# Patient Record
Sex: Male | Born: 1990 | Race: Black or African American | Hispanic: No | Marital: Single | Smoking: Current every day smoker
Health system: Southern US, Community
[De-identification: ages and names within clinical notes are randomized; demographics above are authoritative.]

---

## 2019-08-28 DIAGNOSIS — S62337A Displaced fracture of neck of fifth metacarpal bone, left hand, initial encounter for closed fracture: Secondary | ICD-10-CM | POA: Insufficient documentation

## 2020-10-04 ENCOUNTER — Emergency Department: Payer: BC Managed Care – PPO

## 2020-10-04 ENCOUNTER — Emergency Department: Admit: 2020-10-04 | Discharge status: Absent without leave | Payer: Self-pay

## 2020-10-04 ENCOUNTER — Emergency Department (INDEPENDENT_AMBULATORY_CARE_PROVIDER_SITE_OTHER): Payer: BC Managed Care – PPO

## 2020-10-04 ENCOUNTER — Emergency Department (INDEPENDENT_AMBULATORY_CARE_PROVIDER_SITE_OTHER)
Admission: EM | Admit: 2020-10-04 | Discharge: 2020-10-04 | Disposition: A | Discharge status: Home / Self Care | Payer: BC Managed Care – PPO | Source: Home / Self Care | Attending: Family Medicine | Admitting: Family Medicine

## 2020-10-04 ENCOUNTER — Encounter: Payer: Self-pay | Admitting: Emergency Medicine

## 2020-10-04 ENCOUNTER — Other Ambulatory Visit: Payer: Self-pay

## 2020-10-04 DIAGNOSIS — Y9362 Activity, american flag or touch football: Secondary | ICD-10-CM

## 2020-10-04 DIAGNOSIS — R0781 Pleurodynia: Secondary | ICD-10-CM

## 2020-10-04 DIAGNOSIS — S2231XA Fracture of one rib, right side, initial encounter for closed fracture: Secondary | ICD-10-CM | POA: Diagnosis not present

## 2020-10-04 MED ORDER — ACETAMINOPHEN 325 MG PO TABS
650.0000 mg | ORAL_TABLET | ORAL | Status: AC
  Administered 2020-10-04: 650 mg via ORAL

## 2020-10-04 NOTE — ED Provider Notes (Signed)
[facility]    CSN: [id number] Arrival date & time: 10/04/20  1133      History   Chief Complaint Chief Complaint  Patient presents with  . Chest Pain    HPI Daniel Mcclain is a 29 y.o. male.   While playing flag football four weeks ago, patient fell and another player fell on his chest.  He had mild anterior chest discomfort for several days before improving.  While playing football three days ago, he again fell and a player landed on his sternum area.  He has had persistent right mid-back and chest pain, worse with inspiration and movement.  He denies shortness of breath. He states that he had a fracture in his right hand last year.  The history is provided by the patient.  Chest Pain Pain location:  R chest and R lateral chest Pain quality: aching and sharp   Pain radiates to:  Does not radiate Pain severity:  Mild Onset quality:  Sudden Duration:  3 days Timing:  Constant Progression:  Unchanged Chronicity:  Recurrent Context: breathing, lifting, movement, at rest and trauma   Relieved by:  Nothing Worsened by:  Certain positions, coughing, deep breathing and movement Ineffective treatments: ice pack, Tylenol, ibuprofen. Associated symptoms: back pain   Associated symptoms: no abdominal pain, no cough, no diaphoresis, no fatigue, no fever, no shortness of breath and no syncope     History reviewed. No pertinent past medical history.  Patient Active Problem List   Diagnosis Date Noted  . Displaced fracture of neck of fifth metacarpal bone, left hand, initial encounter for closed fracture 08/28/2019    History reviewed. No pertinent surgical history.     Home Medications    Prior to Admission medications   Not on File    Family History Family History  Problem Relation Age of Onset  . Healthy Mother   . Healthy Father     Social History Social History   Tobacco Use  . Smoking status: Current Every Day Smoker    Packs/day: 0.25     Years: 5.00    Pack years: 1.25    Types: Cigarettes  . Smokeless tobacco: Never Used  Vaping Use  . Vaping Use: Never used  Substance Use Topics  . Alcohol use: Not Currently  . Drug use: Never     Allergies   Iodine and Shellfish allergy   Review of Systems Review of Systems  Constitutional: Negative for diaphoresis, fatigue and fever.  Respiratory: Negative for cough and shortness of breath.   Cardiovascular: Positive for chest pain. Negative for syncope.  Gastrointestinal: Negative for abdominal pain.  Musculoskeletal: Positive for back pain.  All other systems reviewed and are negative.    Physical Exam Triage Vital Signs ED Triage Vitals  Enc Vitals Group     BP 10/04/20 1228 123/84     Pulse Rate 10/04/20 1228 (!) 56     Resp --      Temp 10/04/20 1228 98.7 F (37.1 C)     Temp Source 10/04/20 1228 Oral     SpO2 10/04/20 1228 100 %     Weight 10/04/20 1233 146 lb (66.2 kg)     Height 10/04/20 1233 5\' 8"  (1.727 m)     Head Circumference --      Peak Flow --      Pain Score 10/04/20 1233 6     Pain Loc --      Pain Edu? --  Excl. in GC? --    No data found.  Updated Vital Signs BP 123/84 (BP Location: Right Arm)   Pulse (!) 56   Temp 98.7 F (37.1 C) (Oral)   Ht 5\' 8"  (1.727 m)   Wt 66.2 kg   SpO2 100%   BMI 22.20 kg/m   Visual Acuity Right Eye Distance:   Left Eye Distance:   Bilateral Distance:    Right Eye Near:   Left Eye Near:    Bilateral Near:     Physical Exam Vitals and nursing note reviewed.  Constitutional:      General: He is not in acute distress. HENT:     Head: Atraumatic.     Right Ear: External ear normal.     Left Ear: External ear normal.     Nose: Nose normal.     Mouth/Throat:     Pharynx: Oropharynx is clear.  Eyes:     Conjunctiva/sclera: Conjunctivae normal.     Pupils: Pupils are equal, round, and reactive to light.  Cardiovascular:     Rate and Rhythm: Normal rate and regular rhythm.     Heart  sounds: Normal heart sounds.  Pulmonary:     Effort: No respiratory distress.     Breath sounds: Normal breath sounds. No wheezing or rales.       Comments: Tenderness to palpation right posterior/lateral chest as noted on diagram.  Chest:     Chest wall: Tenderness present.  Abdominal:     Palpations: Abdomen is soft.     Tenderness: There is no abdominal tenderness.  Musculoskeletal:        General: No tenderness.     Cervical back: Normal range of motion.     Right lower leg: No edema.     Left lower leg: No edema.  Skin:    General: Skin is warm and dry.     Findings: No rash.  Neurological:     Mental Status: He is alert and oriented to person, place, and time.      UC Treatments / Results  Labs (all labs ordered are listed, but only abnormal results are displayed) Labs Reviewed  VITAMIN D 25 HYDROXY (VIT D DEFICIENCY, FRACTURES)    EKG   Radiology DG Chest 2 View  Result Date: 10/04/2020 CLINICAL DATA:  Right sided rib pain after a football injury 4 weeks ago. EXAM: CHEST - 2 VIEW COMPARISON:  Rib films same day. FINDINGS: Heart and mediastinal shadows are normal. The lungs are clear. No pneumothorax or hemothorax. Minimally displaced fracture of the right posterior tenth rib better shown by rib radiographs. No evidence of sternal injury. Scoliotic curvature of the spine. IMPRESSION: No active cardiopulmonary disease. Minimally displaced fracture of the right posterior tenth rib better shown by rib radiographs. Electronically Signed   By: 13/01/2020 M.D.   On: 10/04/2020 13:31   DG Ribs Unilateral Right  Result Date: 10/04/2020 CLINICAL DATA:  Right-sided rib pain EXAM: RIGHT RIBS - 2 VIEW COMPARISON:  Chest radiography same day FINDINGS: Minimally displaced fracture of the right posterior tenth rib. No other acute rib finding. Mild scoliotic curvature of the spine. IMPRESSION: Minimally displaced fracture of the right posterior tenth rib. Electronically Signed    By: 13/01/2020 M.D.   On: 10/04/2020 13:30    Procedures Procedures (including critical care time)  Medications Ordered in UC Medications  acetaminophen (TYLENOL) tablet 650 mg (650 mg Oral Given 10/04/20 1247)    Initial Impression /  Assessment and Plan / UC Course  I have reviewed the triage vital signs and the nursing notes.  Pertinent labs & imaging results that were available during my care of the patient were reviewed by me and considered in my medical decision making (see chart for details).    Dispensed rib belt.   Check Vitamin D 25-OH   Final Clinical Impressions(s) / UC Diagnoses   Final diagnoses:  Closed fracture of one rib of right side, initial encounter     Discharge Instructions     If helpful, may wear a rib belt sparingly while at work. Ensure adequate vitamin D and calcium intake. For pain may take Tylenol as needed. Use an Facilities manager several times daily to strengthen rib muscles.    ED Prescriptions    None        Lattie Haw, MD 10/08/20 [phone]

## 2020-10-04 NOTE — ED Triage Notes (Signed)
Mid chest pain/ R sided back pain/left thigh pain  x 4 weeks after being hit in flag football Minimal pain relief w/ OTC Tylenol & Motrin & Ice  NO COVID vaccine Pt states he is sleeping on a new mattress x 2 months on floor - wonders if that is the problem

## 2020-10-04 NOTE — Discharge Instructions (Addendum)
If helpful, may wear a rib belt sparingly while at work. Ensure adequate vitamin D and calcium intake. For pain may take Tylenol as needed. Use an Facilities manager several times daily to strengthen rib muscles.

## 2020-10-05 LAB — VITAMIN D 25 HYDROXY (VIT D DEFICIENCY, FRACTURES): Vit D, 25-Hydroxy: 13 ng/mL — ABNORMAL LOW (ref 30–100)

## 2020-10-07 ENCOUNTER — Telehealth (HOSPITAL_COMMUNITY): Payer: Self-pay | Admitting: Emergency Medicine

## 2020-10-07 MED ORDER — VITAMIN D (ERGOCALCIFEROL) 1.25 MG (50000 UNIT) PO CAPS: 50000.0000 [IU] | ORAL_CAPSULE | ORAL | 0 refills | Status: AC

## 2020-12-21 ENCOUNTER — Other Ambulatory Visit: Payer: Self-pay

## 2020-12-21 ENCOUNTER — Emergency Department (INDEPENDENT_AMBULATORY_CARE_PROVIDER_SITE_OTHER)
Admission: EM | Admit: 2020-12-21 | Discharge: 2020-12-21 | Disposition: A | Discharge status: Home / Self Care | Payer: BC Managed Care – PPO | Source: Home / Self Care

## 2020-12-21 DIAGNOSIS — K089 Disorder of teeth and supporting structures, unspecified: Secondary | ICD-10-CM

## 2020-12-21 DIAGNOSIS — K047 Periapical abscess without sinus: Secondary | ICD-10-CM

## 2020-12-21 MED ORDER — CHLORHEXIDINE GLUCONATE 0.12 % MT SOLN: 15.0000 mL | Freq: Two times a day (BID) | OROMUCOSAL | 0 refills | Status: DC

## 2020-12-21 MED ORDER — AMOXICILLIN 875 MG PO TABS: 875.0000 mg | ORAL_TABLET | Freq: Two times a day (BID) | ORAL | 0 refills | Status: DC

## 2020-12-21 NOTE — ED Provider Notes (Signed)
[facility]    CSN: [id number] Arrival date & time: 12/21/20  1553      History   Chief Complaint Chief Complaint  Patient presents with  . Dental Pain    HPI Daniel Mcclain is a 30 y.o. male.   HPI  Patient presents today with dental pain with possible abscess.  Patient has had dentition problems for over 10 months. He also has some chipped teeth.  He is a current daily smoker.  He has been taking ibuprofen as needed and complains of 10 out of 10 pain related to dental problem.Patient complains of pain in the lower right molar tooth in the right of mouth and also has gingival hyperplasia encapsulating the tooth which is causing significant pain. Patient has dental insurance and has not seen a dental provider.  History reviewed. No pertinent past medical history.  Patient Active Problem List   Diagnosis Date Noted  . Displaced fracture of neck of fifth metacarpal bone, left hand, initial encounter for closed fracture 08/28/2019    History reviewed. No pertinent surgical history.     Home Medications    Prior to Admission medications   Medication Sig Start Date End Date Taking? Authorizing Provider  Vitamin D, Ergocalciferol, (DRISDOL) 1.25 MG (50000 UNIT) CAPS capsule Take 1 capsule (50,000 Units total) by mouth every 7 (seven) days. 10/07/20   LampteyBritta Mccreedy, MD    Family History Family History  Problem Relation Age of Onset  . Healthy Mother   . Healthy Father     Social History Social History   Tobacco Use  . Smoking status: Current Every Day Smoker    Packs/day: 0.25    Years: 5.00    Pack years: 1.25    Types: Cigarettes  . Smokeless tobacco: Never Used  Vaping Use  . Vaping Use: Never used  Substance Use Topics  . Alcohol use: Not Currently  . Drug use: Never     Allergies   Iodine and Shellfish allergy   Review of Systems Review of Systems Pertinent negatives listed in HPI   Physical Exam Triage Vital Signs ED Triage  Vitals  Enc Vitals Group     BP 12/21/20 1707 132/79     Pulse Rate 12/21/20 1707 (!) 57     Resp 12/21/20 1707 18     Temp 12/21/20 1707 98.1 F (36.7 C)     Temp Source 12/21/20 1707 Oral     SpO2 12/21/20 1707 98 %     Weight --      Height --      Head Circumference --      Peak Flow --      Pain Score 12/21/20 1712 10     Pain Loc --      Pain Edu? --      Excl. in GC? --    No data found.  Updated Vital Signs BP 132/79 (BP Location: Right Arm)   Pulse (!) 57   Temp 98.1 F (36.7 C) (Oral)   Resp 18   SpO2 98%   Visual Acuity Right Eye Distance:   Left Eye Distance:   Bilateral Distance:    Right Eye Near:   Left Eye Near:    Bilateral Near:     Physical Exam Constitutional:      Appearance: Normal appearance.  HENT:     Nose: Nose normal.     Mouth/Throat:     Dentition: Abnormal dentition. Gingival swelling and dental caries present.  No gum lesions.     Pharynx: Oropharynx is clear.  Cardiovascular:     Rate and Rhythm: Normal rate and regular rhythm.  Pulmonary:     Effort: Pulmonary effort is normal.     Breath sounds: Normal breath sounds.  Neurological:     Mental Status: He is alert.  Psychiatric:        Attention and Perception: Attention normal.        Mood and Affect: Mood normal.      UC Treatments / Results  Labs (all labs ordered are listed, but only abnormal results are displayed) Labs Reviewed - No data to display  EKG   Radiology No results found.  Procedures Procedures (including critical care time)  Medications Ordered in UC Medications - No data to display  Initial Impression / Assessment and Plan / UC Course  I have reviewed the triage vital signs and the nursing notes.  Pertinent labs & imaging results that were available during my care of the patient were reviewed by me and considered in my medical decision making (see chart for details).    Dental infection. Treatment per discharge instructions. Information  provided to follow-up with dentist and establish with primary care.  Final Clinical Impressions(s) / UC Diagnoses   Final diagnoses:  Dental infection  Poor dentition     Discharge Instructions      Take all antibiotics as prescribed.  Rinse and spit with the Peridex oral mouth rinse 2 times daily until the entire course of rinses completed. I have provided you with contact information to establish with a primary care provider and a dental provider.     ED Prescriptions    Medication Sig Dispense Auth. Provider   amoxicillin (AMOXIL) 875 MG tablet Take 1 tablet (875 mg total) by mouth 2 (two) times daily. 20 tablet Bing Neighbors, FNP   chlorhexidine (PERIDEX) 0.12 % solution Use as directed 15 mLs in the mouth or throat 2 (two) times daily. 120 mL Bing Neighbors, FNP     PDMP not reviewed this encounter.   Bing Neighbors, FNP 12/24/20 [phone]

## 2020-12-21 NOTE — Discharge Instructions (Addendum)
  Take all antibiotics as prescribed.  Rinse and spit with the Peridex oral mouth rinse 2 times daily until the entire course of rinses completed. I have provided you with contact information to establish with a primary care provider and a dental provider.

## 2020-12-21 NOTE — ED Triage Notes (Signed)
Pt c/o dental pain, possible abcess x 10 mos. Some molars are chipped. Pain 10/10 Ibuprofen prn.

## 2021-09-28 ENCOUNTER — Emergency Department (HOSPITAL_COMMUNITY)
Admission: EM | Admit: 2021-09-28 | Discharge: 2021-09-29 | Disposition: A | Discharge status: Home / Self Care | Payer: BC Managed Care – PPO | Attending: Emergency Medicine | Admitting: Emergency Medicine

## 2021-09-28 ENCOUNTER — Encounter (HOSPITAL_COMMUNITY): Payer: Self-pay

## 2021-09-28 ENCOUNTER — Other Ambulatory Visit: Payer: Self-pay

## 2021-09-28 ENCOUNTER — Emergency Department (HOSPITAL_COMMUNITY): Payer: BC Managed Care – PPO

## 2021-09-28 DIAGNOSIS — S6991XA Unspecified injury of right wrist, hand and finger(s), initial encounter: Secondary | ICD-10-CM | POA: Diagnosis present

## 2021-09-28 DIAGNOSIS — W268XXA Contact with other sharp object(s), not elsewhere classified, initial encounter: Secondary | ICD-10-CM | POA: Diagnosis not present

## 2021-09-28 DIAGNOSIS — S61411A Laceration without foreign body of right hand, initial encounter: Secondary | ICD-10-CM | POA: Insufficient documentation

## 2021-09-28 DIAGNOSIS — F17210 Nicotine dependence, cigarettes, uncomplicated: Secondary | ICD-10-CM | POA: Insufficient documentation

## 2021-09-28 MED ORDER — LIDOCAINE-EPINEPHRINE 2 %-1:100000 IJ SOLN: 20.0000 mL | Freq: Once | INTRAMUSCULAR | Status: DC

## 2021-09-28 MED ORDER — TETANUS-DIPHTH-ACELL PERTUSSIS 5-2.5-18.5 LF-MCG/0.5 IM SUSY
0.5000 mL | PREFILLED_SYRINGE | Freq: Once | INTRAMUSCULAR | Status: DC
  Filled 2021-09-28: qty 0.5

## 2021-09-28 MED ORDER — LIDOCAINE-EPINEPHRINE (PF) 2 %-1:200000 IJ SOLN
INTRAMUSCULAR | Status: AC
  Administered 2021-09-29: 10 mL
  Filled 2021-09-28: qty 20

## 2021-09-28 NOTE — Discharge Instructions (Signed)
Please follow instruction below for wound care.  Have your sutures remove in 7 days at Urgent Care center or at your doctor's office.  Return if you notice any sign of infection.

## 2021-09-28 NOTE — ED Triage Notes (Addendum)
Pt states that he hit a mirror and cut his right hand inner thumb side x 2 hrs ago.

## 2021-09-28 NOTE — ED Provider Notes (Addendum)
[facility]ENCY [facility]PT Provider Note   CSN: [id number] Arrival date & time: 09/28/21  2214     History Chief Complaint  Patient presents with   Laceration    Daniel Mcclain is a 30 y.o. male.  The history is provided by the patient. No language interpreter was used.  Laceration Associated symptoms: no fever    30 year old male presenting for evaluation of hand injury.  Patient reports that he accidentally cut his right dominant hand approximately 2 hours ago when he got upset and hit a mirror.  He reported cute onset of sharp throbbing 7 out of 10 pain.  Pain is nonradiating no associated numbness.  No other injury.  He is unsure his last tetanus status.  No specific treatment tried.  No other injury  History reviewed. No pertinent past medical history.  Patient Active Problem List   Diagnosis Date Noted   Displaced fracture of neck of fifth metacarpal bone, left hand, initial encounter for closed fracture 08/28/2019    History reviewed. No pertinent surgical history.     Family History  Problem Relation Age of Onset   Healthy Mother    Healthy Father     Social History   Tobacco Use   Smoking status: Every Day    Packs/day: 0.25    Years: 5.00    Pack years: 1.25    Types: Cigarettes   Smokeless tobacco: Never  Vaping Use   Vaping Use: Never used  Substance Use Topics   Alcohol use: Not Currently   Drug use: Never    Home Medications Prior to Admission medications   Medication Sig Start Date End Date Taking? Authorizing Provider  amoxicillin (AMOXIL) 875 MG tablet Take 1 tablet (875 mg total) by mouth 2 (two) times daily. 12/21/20   Bing Neighbors, FNP  chlorhexidine (PERIDEX) 0.12 % solution Use as directed 15 mLs in the mouth or throat 2 (two) times daily. 12/21/20   Bing Neighbors, FNP  Vitamin D, Ergocalciferol, (DRISDOL) 1.25 MG (50000 UNIT) CAPS capsule Take 1 capsule (50,000 Units total) by mouth every 7 (seven) days.  10/07/20   LampteyBritta Mccreedy, MD    Allergies    Iodine and Shellfish allergy  Review of Systems   Review of Systems  Constitutional:  Negative for fever.  Skin:  Positive for wound.  Neurological:  Negative for numbness.   Physical Exam Updated Vital Signs BP (!) 128/91   Pulse 86   Temp 98.2 F (36.8 C) (Oral)   Resp 15   SpO2 100%   Physical Exam Vitals and nursing note reviewed.  Constitutional:      General: He is not in acute distress.    Appearance: He is well-developed.  HENT:     Head: Atraumatic.  Eyes:     Conjunctiva/sclera: Conjunctivae normal.  Musculoskeletal:        General: Signs of injury (Right hand: 4 cm oblique laceration noted along the hypothenar eminence without foreign body noted.  Sensation is intact distally.) present.     Cervical back: Neck supple.  Skin:    Findings: No rash.  Neurological:     Mental Status: He is alert.    ED Results / Procedures / Treatments   Labs (all labs ordered are listed, but only abnormal results are displayed) Labs Reviewed - No data to display  EKG None  Radiology DG Hand Complete Right  Result Date: 09/28/2021 CLINICAL DATA:  Hit mirror, laceration EXAM: RIGHT HAND -  COMPLETE 3+ VIEW COMPARISON:  None. FINDINGS: No acute bony abnormality. Specifically, no fracture, subluxation, or dislocation. Screw within the right 5th metacarpal. Soft tissues are intact. No radiopaque foreign bodies. IMPRESSION: No acute fracture or foreign body. Electronically Signed   By: Charlett Nose M.D.   On: 09/28/2021 23:02    Procedures .Marland KitchenLaceration Repair  Date/Time: 09/29/2021 12:00 AM Performed by: Fayrene Helper, PA-C Authorized by: Fayrene Helper, PA-C   Consent:    Consent obtained:  Verbal   Consent given by:  Patient   Risks discussed:  Infection, need for additional repair, pain, poor cosmetic result and poor wound healing   Alternatives discussed:  No treatment and delayed treatment Universal protocol:     Procedure explained and questions answered to patient or proxy's satisfaction: yes     Relevant documents present and verified: yes     Test results available: yes     Imaging studies available: yes     Required blood products, implants, devices, and special equipment available: yes     Site/side marked: yes     Immediately prior to procedure, a time out was called: yes     Patient identity confirmed:  Verbally with patient Anesthesia:    Anesthesia method:  Local infiltration   Local anesthetic:  Lidocaine 1% WITH epi Laceration details:    Location:  Hand   Hand location:  R palm   Length (cm):  4   Depth (mm):  3 Pre-procedure details:    Preparation:  Patient was prepped and draped in usual sterile fashion and imaging obtained to evaluate for foreign bodies Exploration:    Limited defect created (wound extended): yes     Hemostasis achieved with:  Direct pressure   Imaging outcome: foreign body not noted     Wound exploration: wound explored through full range of motion and entire depth of wound visualized     Contaminated: no   Treatment:    Area cleansed with:  Povidone-iodine and saline   Irrigation solution:  Sterile saline   Irrigation method:  Pressure wash   Visualized foreign bodies/material removed: no     Debridement:  Minimal (debridement of dead skin using sterile scissor)   Undermining:  None Skin repair:    Repair method:  Sutures   Suture size:  5-0   Suture material:  Prolene   Suture technique:  Simple interrupted   Number of sutures:  8 Approximation:    Approximation:  Close Repair type:    Repair type:  Intermediate Post-procedure details:    Dressing:  Non-adherent dressing   Procedure completion:  Tolerated well, no immediate complications   Medications Ordered in ED Medications  lidocaine-EPINEPHrine (XYLOCAINE W/EPI) 2 %-1:100000 (with pres) injection 20 mL (has no administration in time range)  Tdap (BOOSTRIX) injection 0.5 mL (has no  administration in time range)  lidocaine-EPINEPHrine (XYLOCAINE W/EPI) 2 %-1:200000 (PF) injection (has no administration in time range)    ED Course  I have reviewed the triage vital signs and the nursing notes.  Pertinent labs & imaging results that were available during my care of the patient were reviewed by me and considered in my medical decision making (see chart for details).    MDM Rules/Calculators/A&P                           BP (!) 128/91   Pulse 86   Temp 98.2 F (36.8 C) (Oral)   Resp 15  SpO2 100%   Final Clinical Impression(s) / ED Diagnoses Final diagnoses:  Laceration of right hand without foreign body, initial encounter    Rx / DC Orders ED Discharge Orders     None      12:01 AM Patient suffered a laceration to his right dominant hand involving the hypothenar muscle.  He is neurovascular intact.  No foreign body noted.  Laceration repair using nonabsorbable sutures which will need to be removed in 7 days.  Wound care instruction provided.  Tetanus updated.   Fayrene Helper, PA-C 09/29/21 0003    Fayrene Helper, PA-C 09/29/21 Salley Hews    Mancel Bale, MD 09/29/21 [phone]

## 2021-09-29 MED ORDER — LIDOCAINE-EPINEPHRINE (PF) 2 %-1:200000 IJ SOLN: 20.0000 mL | Freq: Once | INTRAMUSCULAR | Status: AC

## 2021-10-25 ENCOUNTER — Ambulatory Visit: Payer: Self-pay

## 2021-10-27 ENCOUNTER — Ambulatory Visit: Payer: Self-pay

## 2021-10-30 ENCOUNTER — Ambulatory Visit (HOSPITAL_COMMUNITY)
Admission: EM | Admit: 2021-10-30 | Discharge: 2021-10-30 | Disposition: A | Discharge status: Home / Self Care | Payer: BC Managed Care – PPO

## 2021-10-30 ENCOUNTER — Other Ambulatory Visit: Payer: Self-pay

## 2021-10-30 DIAGNOSIS — Z4802 Encounter for removal of sutures: Secondary | ICD-10-CM | POA: Diagnosis not present

## 2021-10-30 NOTE — ED Triage Notes (Signed)
Pt presents for suture removal; removed 7 sutures from right hand.

## 2021-11-08 ENCOUNTER — Other Ambulatory Visit: Payer: Self-pay

## 2021-11-08 ENCOUNTER — Encounter (HOSPITAL_COMMUNITY): Payer: Self-pay

## 2021-11-08 ENCOUNTER — Ambulatory Visit (HOSPITAL_COMMUNITY)
Admission: EM | Admit: 2021-11-08 | Discharge: 2021-11-08 | Disposition: A | Discharge status: Home / Self Care | Payer: BC Managed Care – PPO | Attending: Emergency Medicine | Admitting: Emergency Medicine

## 2021-11-08 DIAGNOSIS — H66001 Acute suppurative otitis media without spontaneous rupture of ear drum, right ear: Secondary | ICD-10-CM | POA: Diagnosis present

## 2021-11-08 DIAGNOSIS — Z20822 Contact with and (suspected) exposure to covid-19: Secondary | ICD-10-CM | POA: Diagnosis not present

## 2021-11-08 MED ORDER — AMOXICILLIN-POT CLAVULANATE 875-125 MG PO TABS: 1.0000 | ORAL_TABLET | Freq: Two times a day (BID) | ORAL | 0 refills | Status: AC

## 2021-11-08 MED ORDER — FLUTICASONE PROPIONATE 50 MCG/ACT NA SUSP: 2.0000 | Freq: Every day | NASAL | 0 refills | Status: AC

## 2021-11-08 NOTE — Discharge Instructions (Addendum)
Finish the Augmentin, even if you feel better.  Flonase, saline nasal irrigation with a Lloyd Huger Med rinse with distilled water as often as you want for the nasal congestion.  Mucinex D will also help.  COVID Will be back 6 to 24 hours  Below is a list of primary care practices who are taking new patients for you to follow-up with.  [facility] -multiple locations.  See website at [email or web address]  [facility] Ground Floor - [facility], [address] [place], [place], [place] [address] [phone]  [facility] at [facility] [address] [place], [place] [address] [phone]  [facility] [address] [place], [place] [address] [phone]  [facility] [phone] [address] [place] [address]  [facility]: [address] [place] [address]  [phone]  [facility]: [address] [place] [address]  [phone]  [facility] : [address] [place] [address] [phone]  [facility]: [address] [place] [address] [phone]  [facility]: [address] [place] [address] [phone]  Dr. Oneal Grout [address] [facility] [place] [address]  [phone]  Go to www.goodrx.com  or www.[email or web address] to look up your medications. This will give you a list of where you can find your prescriptions at the most affordable prices. Or ask the pharmacist what the cash price is, or if they have any other discount programs available to help make your medication more affordable. This can be less expensive than what you would pay with insurance.

## 2021-11-08 NOTE — ED Provider Notes (Signed)
HPI  SUBJECTIVE:  Daniel Mcclain is a 30 y.o. male who presents with 2 and half days of right ear pain, nasal congestion, rhinorrhea, ear popping, decreased hearing, sore throat, postnasal drip, cough productive of the same material as his nasal congestion, fatigue, headache.  No fevers, otorrhea, loss of sense of smell or taste, shortness of breath, nausea, vomiting, diarrhea, abdominal pain, body aches.  He is also requesting COVID testing.  He was exposed 3 days ago.  He did not get the COVID or flu vaccine.  No known influenza exposure.  No antibiotics in the past month.  No Antipyretic in the past 6 hours.  He does not grind his teeth at night.  He tried tea and vitamins without improvement in his symptoms.  His ear pain is worse with chewing, coughing, lying on his right side and with exposure to loud noise.  He has no past medical history.  PMD: None.  History reviewed. No pertinent past medical history.  History reviewed. No pertinent surgical history.  Family History  Problem Relation Age of Onset   Healthy Mother    Healthy Father     Social History   Tobacco Use   Smoking status: Every Day    Packs/day: 0.25    Years: 5.00    Pack years: 1.25    Types: Cigarettes   Smokeless tobacco: Never  Vaping Use   Vaping Use: Never used  Substance Use Topics   Alcohol use: Not Currently   Drug use: Never    No current facility-administered medications for this encounter.  Current Outpatient Medications:    amoxicillin-clavulanate (AUGMENTIN) 875-125 MG tablet, Take 1 tablet by mouth 2 (two) times daily for 7 days., Disp: 14 tablet, Rfl: 0   fluticasone (FLONASE) 50 MCG/ACT nasal spray, Place 2 sprays into both nostrils daily., Disp: 16 g, Rfl: 0   Vitamin D, Ergocalciferol, (DRISDOL) 1.25 MG (50000 UNIT) CAPS capsule, Take 1 capsule (50,000 Units total) by mouth every 7 (seven) days., Disp: 6 capsule, Rfl: 0  Allergies  Allergen Reactions   Iodine Itching and Swelling    Shellfish Allergy Anaphylaxis     ROS  As noted in HPI.   Physical Exam  BP 109/60 (BP Location: Right Arm)   Pulse 62   Temp 98.4 F (36.9 C) (Oral)   Resp 18   SpO2 97%   Constitutional: Well developed, well nourished, no acute distress Eyes:  EOMI, conjunctiva normal bilaterally HENT: Normocephalic, atraumatic,mucus membranes moist. right external ear, EAC normal.  No pain with traction on pinna, palpation tragus or mastoid.  TM erythematous, dull, bulging.  Decreased hearing right ear compared to left.  Left TM normal.  Positive nasal congestion, erythematous, swollen turbinates.  No maxillary, frontal sinus tenderness.  Normal oropharynx.  No postnasal drip.  Uvula midline.  Neck: No cervical lymphadenopathy.  Respiratory: Normal inspiratory effort, lungs clear bilaterally Cardiovascular: Normal rate, regular rhythm, no murmurs rubs or gallops GI: nondistended skin: No rash, skin intact Musculoskeletal: no deformities Neurologic: Alert & oriented x 3, no focal neuro deficits Psychiatric: Speech and behavior appropriate   ED Course   Medications - No data to display  Orders Placed This Encounter  Procedures   SARS CORONAVIRUS 2 (TAT 6-24 HRS) Nasopharyngeal Nasopharyngeal Swab    Standing Status:   Standing    Number of Occurrences:   1    No results found for this or any previous visit (from the past 24 hour(s)). No results found.  ED Clinical  Impression  1. Non-recurrent acute suppurative otitis media of right ear without spontaneous rupture of tympanic membrane   2. Encounter for laboratory testing for COVID-19 virus      ED Assessment/Plan  Patient with a right-sided otitis media.  will also check COVID due to recent exposure.  He may be a candidate for Molnupiravir based on nonvaccinated status, however, he has no other medical comorbidities.  Home with Augmentin, Flonase, saline nasal irrigation, Mucinex D.  Will provide primary care list and order  assistance in finding a PMD.  Discussed MDM, treatment plan, and plan for follow-up with patient. patient agrees with plan.   Meds ordered this encounter  Medications   amoxicillin-clavulanate (AUGMENTIN) 875-125 MG tablet    Sig: Take 1 tablet by mouth 2 (two) times daily for 7 days.    Dispense:  14 tablet    Refill:  0   fluticasone (FLONASE) 50 MCG/ACT nasal spray    Sig: Place 2 sprays into both nostrils daily.    Dispense:  16 g    Refill:  0      *This clinic note was created using Scientist, clinical (histocompatibility and immunogenetics). Therefore, there may be occasional mistakes despite careful proofreading.  ?    Domenick Gong, MD 11/08/21 [phone]

## 2021-11-08 NOTE — ED Triage Notes (Signed)
Pt presents with cough, chills, and bilateral ear pain X 3 days.

## 2021-11-09 LAB — SARS CORONAVIRUS 2 (TAT 6-24 HRS): SARS Coronavirus 2: NEGATIVE

## 2022-04-26 ENCOUNTER — Emergency Department (INDEPENDENT_AMBULATORY_CARE_PROVIDER_SITE_OTHER)
Admission: EM | Admit: 2022-04-26 | Discharge: 2022-04-26 | Disposition: A | Discharge status: Home / Self Care | Payer: Self-pay | Source: Home / Self Care

## 2022-04-26 ENCOUNTER — Encounter: Payer: Self-pay | Admitting: Emergency Medicine

## 2022-04-26 DIAGNOSIS — K047 Periapical abscess without sinus: Secondary | ICD-10-CM

## 2022-04-26 MED ORDER — AMOXICILLIN-POT CLAVULANATE 875-125 MG PO TABS: 1.0000 | ORAL_TABLET | Freq: Two times a day (BID) | ORAL | 0 refills | Status: AC

## 2022-04-26 NOTE — ED Provider Notes (Signed)
[facility]    CSN: [id number] Arrival date & time: 04/26/22  1949      History   Chief Complaint Chief Complaint  Patient presents with   Dental Pain    HPI Daniel Mcclain is a 31 y.o. male.   HPI-year-old male presents with dental pain x2 days.  History reviewed. No pertinent past medical history.  Patient Active Problem List   Diagnosis Date Noted   Displaced fracture of neck of fifth metacarpal bone, left hand, initial encounter for closed fracture 08/28/2019    History reviewed. No pertinent surgical history.     Home Medications    Prior to Admission medications   Medication Sig Start Date End Date Taking? Authorizing Provider  amoxicillin-clavulanate (AUGMENTIN) 875-125 MG tablet Take 1 tablet by mouth every 12 (twelve) hours for 10 days. 04/26/22 05/06/22 Yes Trevor Iha, FNP  fluticasone (FLONASE) 50 MCG/ACT nasal spray Place 2 sprays into both nostrils daily. 11/08/21   Domenick Gong, MD  Vitamin D, Ergocalciferol, (DRISDOL) 1.25 MG (50000 UNIT) CAPS capsule Take 1 capsule (50,000 Units total) by mouth every 7 (seven) days. 10/07/20   Lamptey, Britta Mccreedy, MD    Family History Family History  Problem Relation Age of Onset   Healthy Mother    Healthy Father     Social History Social History   Tobacco Use   Smoking status: Every Day    Packs/day: 0.25    Years: 5.00    Pack years: 1.25    Types: Cigarettes   Smokeless tobacco: Never  Vaping Use   Vaping Use: Never used  Substance Use Topics   Alcohol use: Not Currently   Drug use: Never     Allergies   Iodine and Shellfish allergy   Review of Systems Review of Systems  HENT:  Positive for dental problem.   All other systems reviewed and are negative.   Physical Exam Triage Vital Signs ED Triage Vitals  Enc Vitals Group     BP      Pulse      Resp      Temp      Temp src      SpO2      Weight      Height      Head Circumference      Peak Flow      Pain Score       Pain Loc      Pain Edu?      Excl. in GC?    No data found.  Updated Vital Signs BP 100/71 (BP Location: Right Arm)   Pulse 93   Temp 98.5 F (36.9 C) (Oral)   Resp 18   SpO2 97%      Physical Exam Vitals and nursing note reviewed.  Constitutional:      Appearance: Normal appearance. He is normal weight.  HENT:     Head: Normocephalic and atraumatic.     Right Ear: Tympanic membrane, ear canal and external ear normal.     Left Ear: Tympanic membrane, ear canal and external ear normal.     Mouth/Throat:     Mouth: Mucous membranes are moist.     Pharynx: Oropharynx is clear.     Comments: Left lower: Second/third molar erythematous, indurated medial/lateral gingival border noted, mild mucopurulent discharge Eyes:     Extraocular Movements: Extraocular movements intact.     Conjunctiva/sclera: Conjunctivae normal.     Pupils: Pupils are equal, round, and reactive to light.  Cardiovascular:     Rate and Rhythm: Normal rate and regular rhythm.     Pulses: Normal pulses.     Heart sounds: Normal heart sounds. No murmur heard. Pulmonary:     Effort: Pulmonary effort is normal.     Breath sounds: Normal breath sounds. No wheezing, rhonchi or rales.  Musculoskeletal:     Cervical back: Normal range of motion and neck supple.  Skin:    General: Skin is warm and dry.  Neurological:     General: No focal deficit present.     Mental Status: He is alert and oriented to person, place, and time.     UC Treatments / Results  Labs (all labs ordered are listed, but only abnormal results are displayed) Labs Reviewed - No data to display  EKG   Radiology No results found.  Procedures Procedures (including critical care time)  Medications Ordered in UC Medications - No data to display  Initial Impression / Assessment and Plan / UC Course  I have reviewed the triage vital signs and the nursing notes.  Pertinent labs & imaging results that were available during my  care of the patient were reviewed by me and considered in my medical decision making (see chart for details).     MDM: 1.  Dental abscess-Rx'd Augmentin. Instructed patient to take medication as directed with food to completion.  Encouraged patient increase daily water intake while taking this medication.  Advised/encouraged patient to follow-up with dentist early next week for further evaluation of dental abscess.  Note provided to patient per request. Patient discharged home, hemodynamically stable. Final Clinical Impressions(s) / UC Diagnoses   Final diagnoses:  Dental abscess     Discharge Instructions      Instructed patient to take medication as directed with food to completion.  Encouraged patient increase daily water intake while taking this medication.  Advised/encouraged patient to follow-up with dentist early next week for further evaluation of dental abscess.     ED Prescriptions     Medication Sig Dispense Auth. Provider   amoxicillin-clavulanate (AUGMENTIN) 875-125 MG tablet Take 1 tablet by mouth every 12 (twelve) hours for 10 days. 20 tablet Trevor Iha, FNP      PDMP not reviewed this encounter.   Trevor Iha, FNP 04/26/22 2017

## 2022-04-26 NOTE — ED Triage Notes (Signed)
Patient presents to Urgent Care with complaints of tooth pain/abscess since 6 month. Patient reports having pain. His tooth broke and then has become infected.

## 2022-04-26 NOTE — Discharge Instructions (Addendum)
Instructed patient to take medication as directed with food to completion.  Encouraged patient increase daily water intake while taking this medication.  Advised/encouraged patient to follow-up with dentist early next week for further evaluation of dental abscess.

## 2022-09-11 IMAGING — DX DG CHEST 2V
2 series · 2 of 2 positions shown · non-contrast
Comparison: Rib films same day.

CLINICAL DATA: Right sided rib pain after a football injury 4 weeks
ago.

EXAM:
CHEST - 2 VIEW

[chest pa]
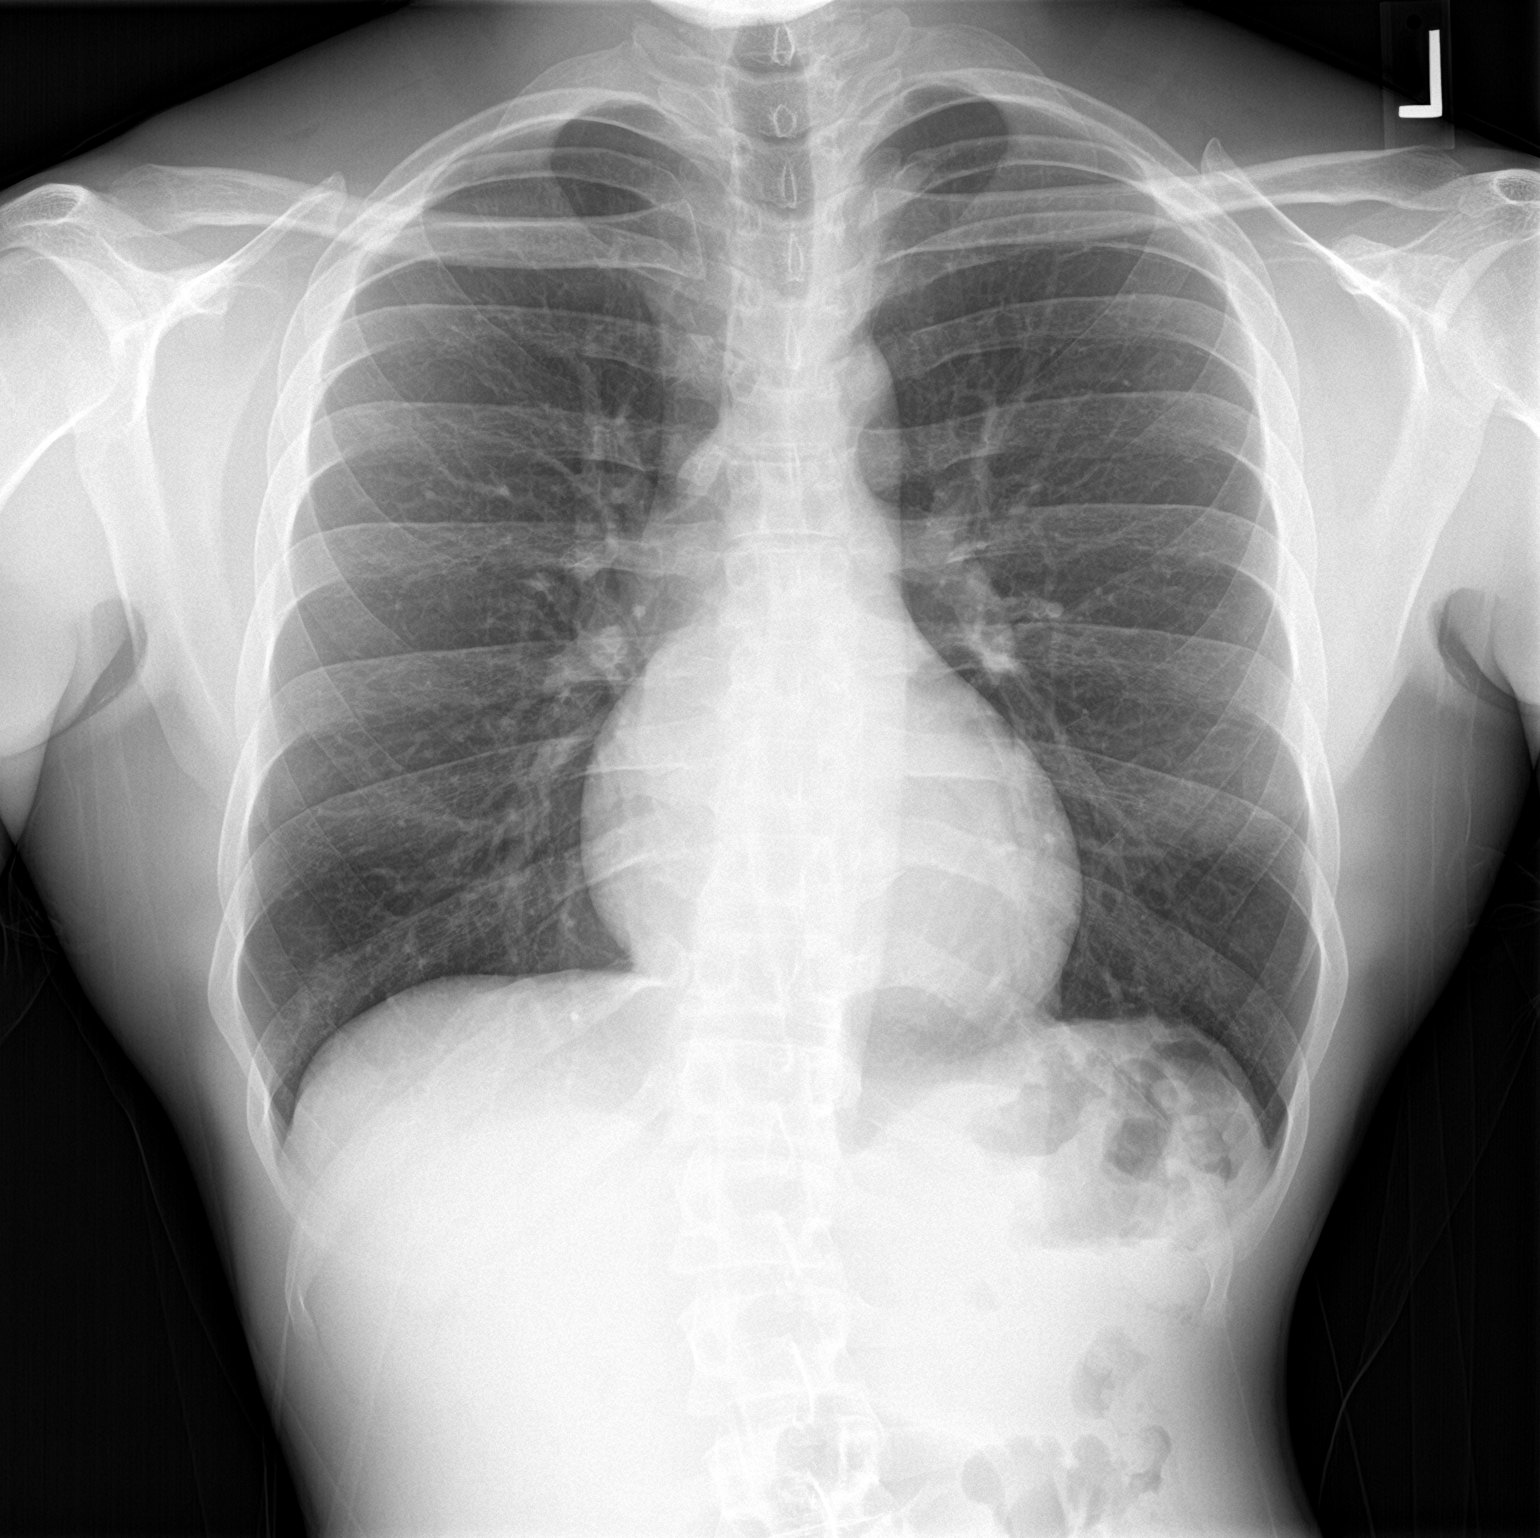

[chest lat]
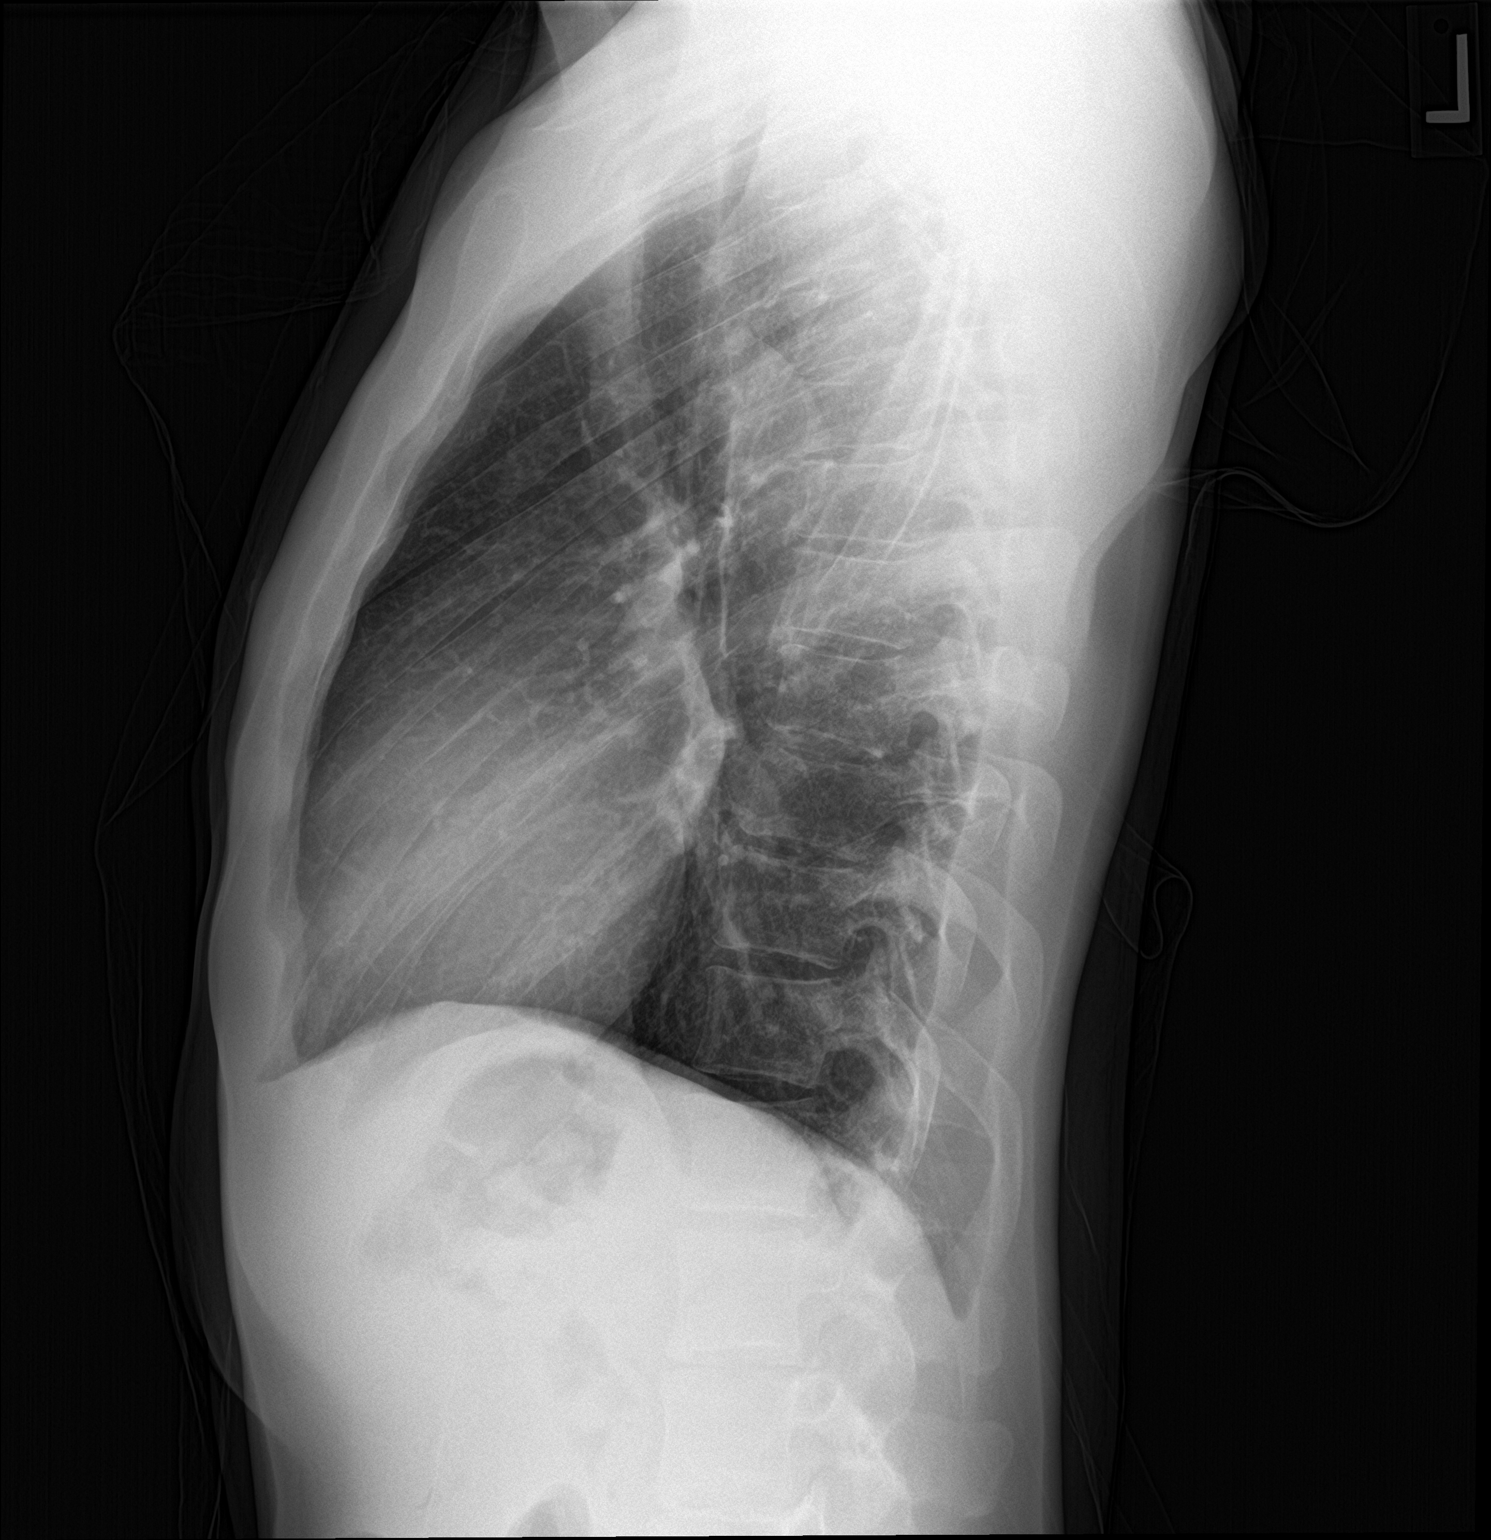

[2 of 2 positions shown; findings below may reference images not displayed]

FINDINGS: Heart and mediastinal shadows are normal. The lungs are clear. No
pneumothorax or hemothorax. Minimally displaced fracture of the
right posterior tenth rib better shown by rib radiographs. No
evidence of sternal injury. Scoliotic curvature of the spine.
IMPRESSION: No active cardiopulmonary disease. Minimally displaced fracture of
the right posterior tenth rib better shown by rib radiographs.

## 2022-09-11 IMAGING — DX DG RIBS 2V*R*
2 series · 2 of 2 positions shown · non-contrast
Comparison: Chest radiography same day

CLINICAL DATA: Right-sided rib pain

EXAM:
RIGHT RIBS - 2 VIEW

[rib ap]
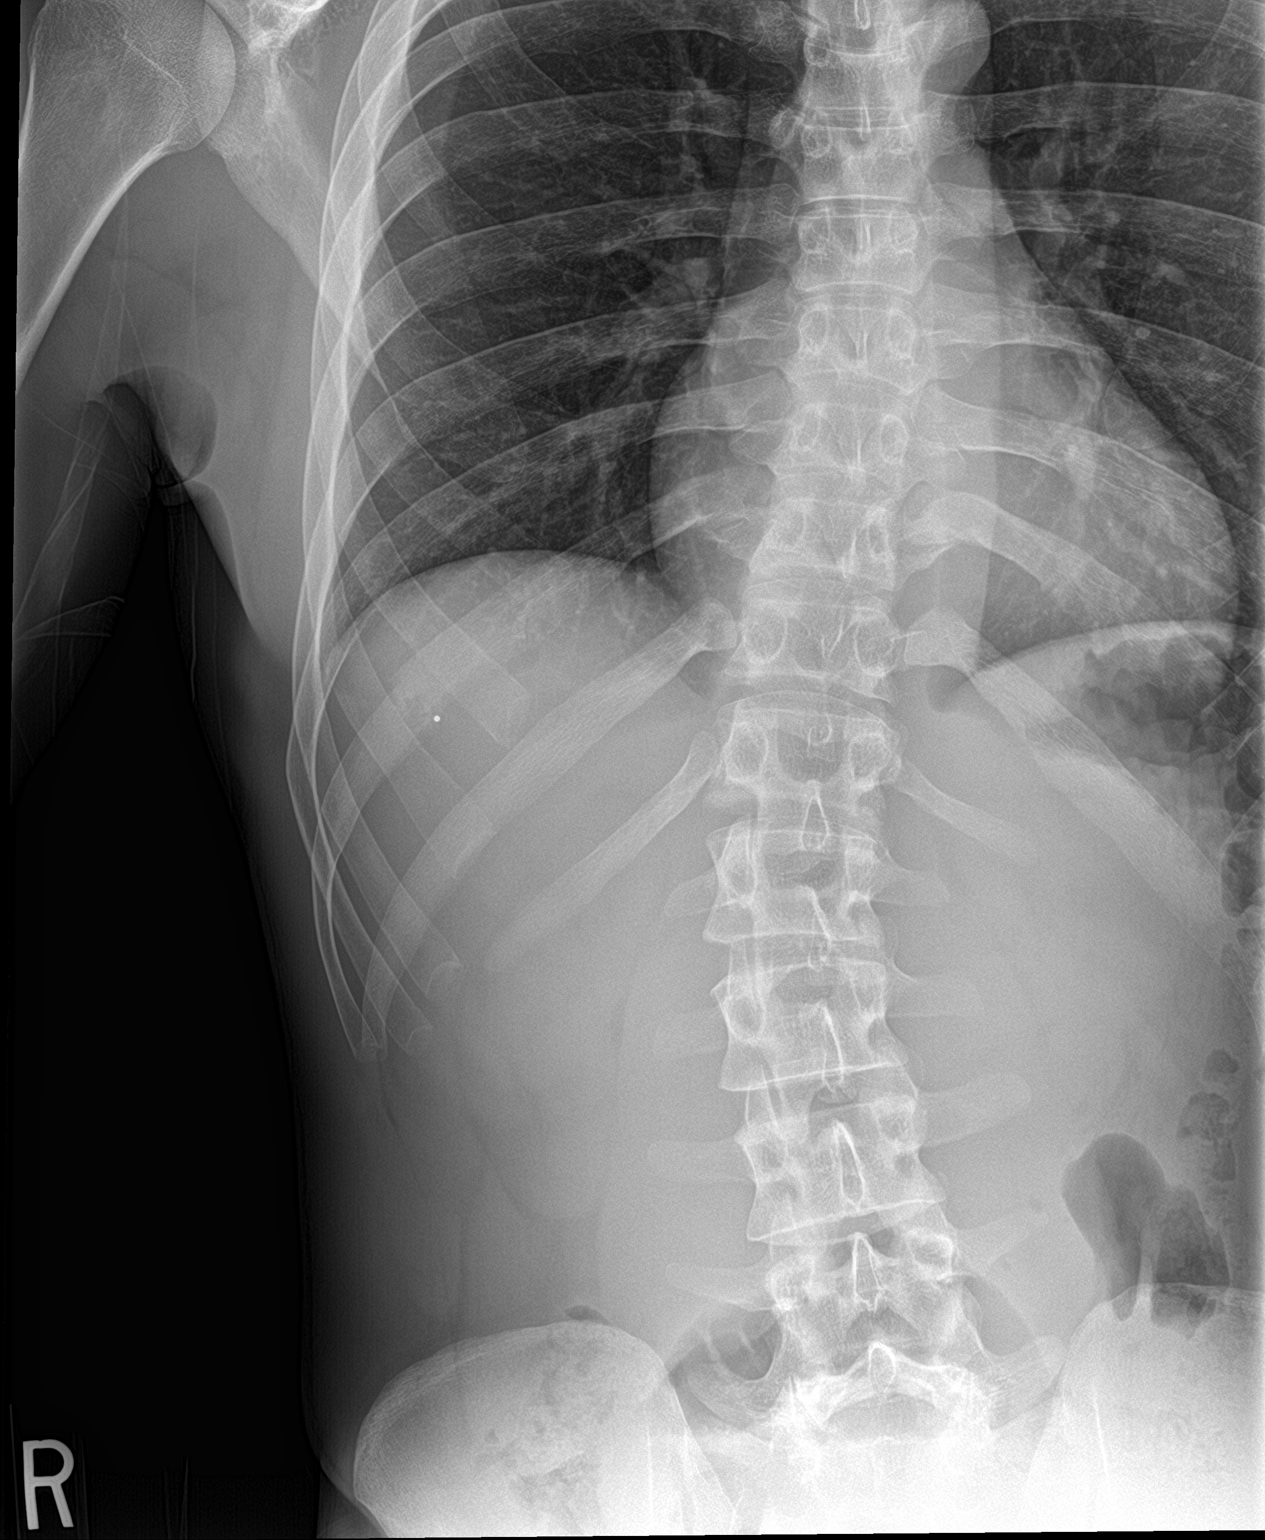

[rib ap obl]
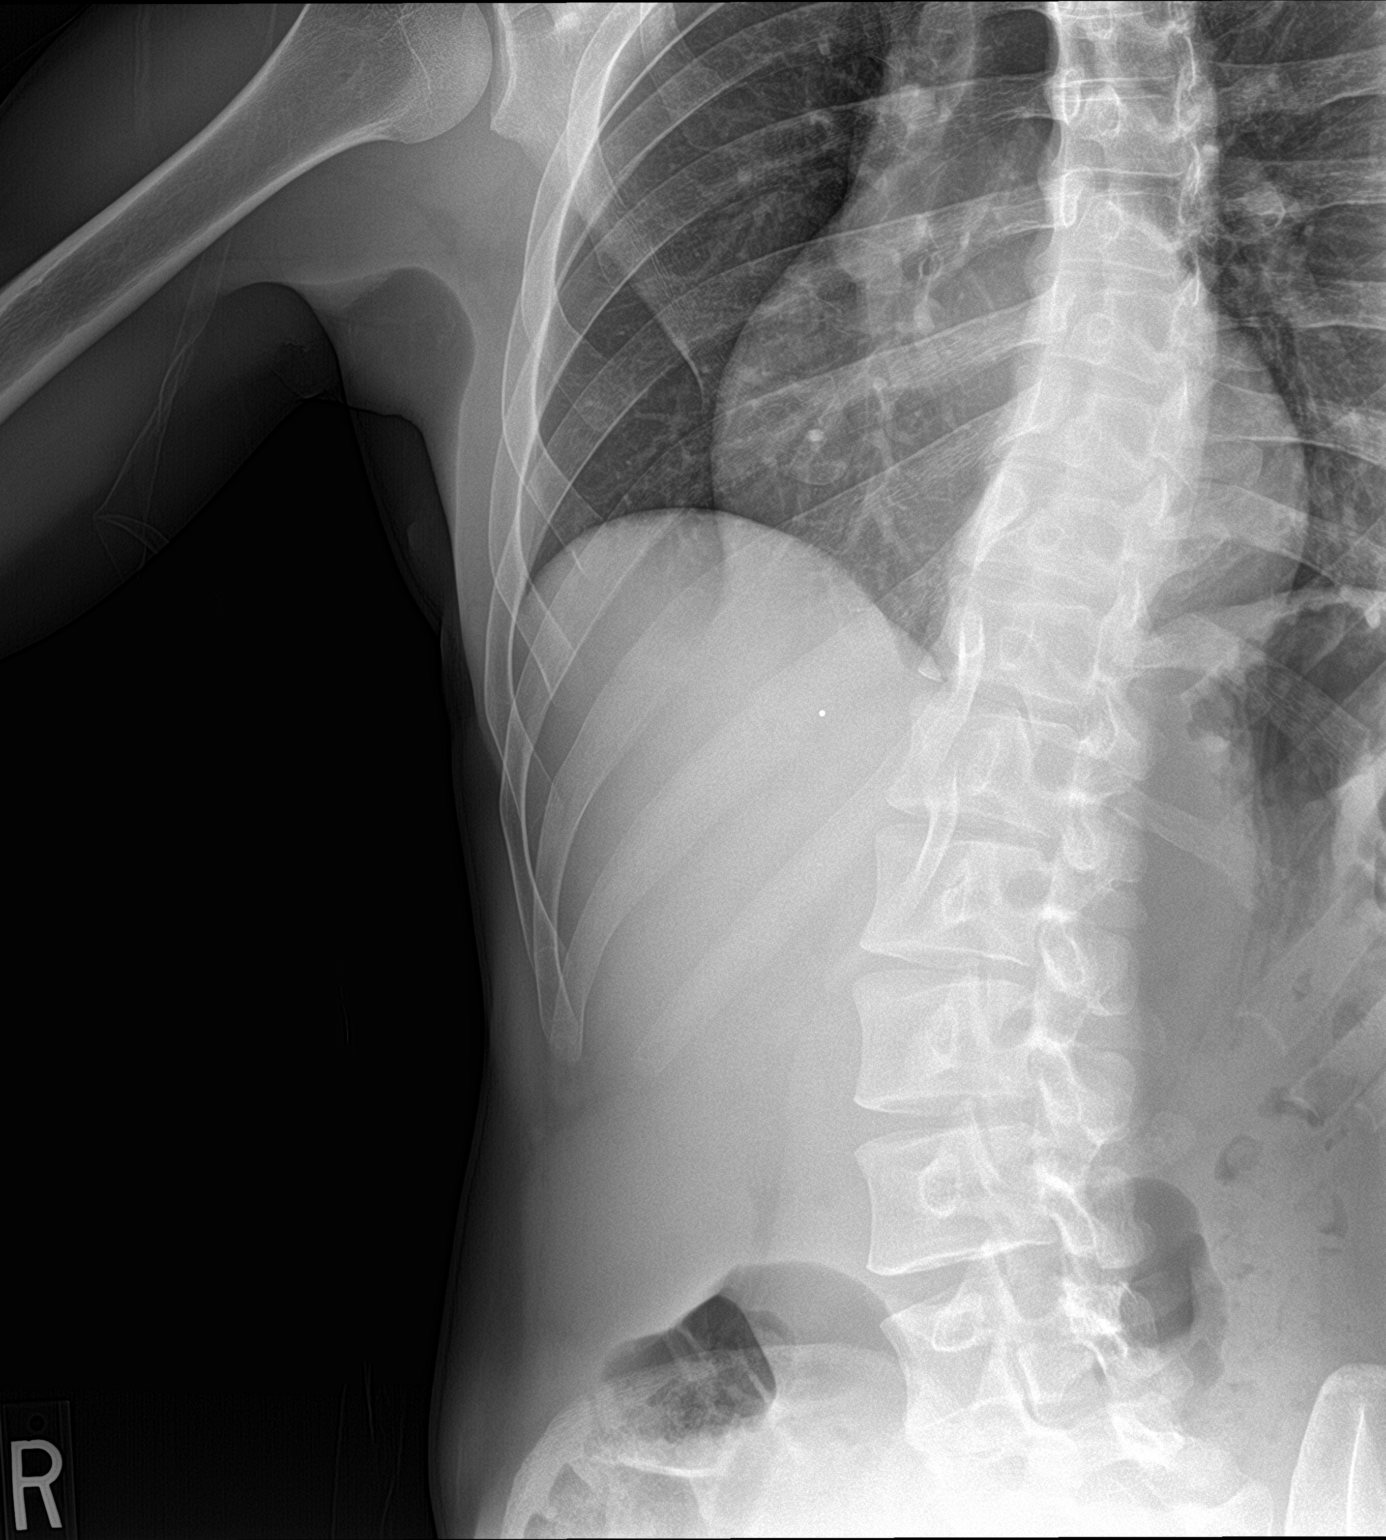

[2 of 2 positions shown; findings below may reference images not displayed]

FINDINGS: Minimally displaced fracture of the right posterior tenth rib. No
other acute rib finding. Mild scoliotic curvature of the spine.
IMPRESSION: Minimally displaced fracture of the right posterior tenth rib.

## 2023-09-05 IMAGING — DX DG HAND COMPLETE 3+V*R*
3 series · 3 of 3 positions shown · non-contrast
Comparison: None.

CLINICAL DATA: Hit mirror, laceration

EXAM:
RIGHT HAND - COMPLETE 3+ VIEW

[hand ap]
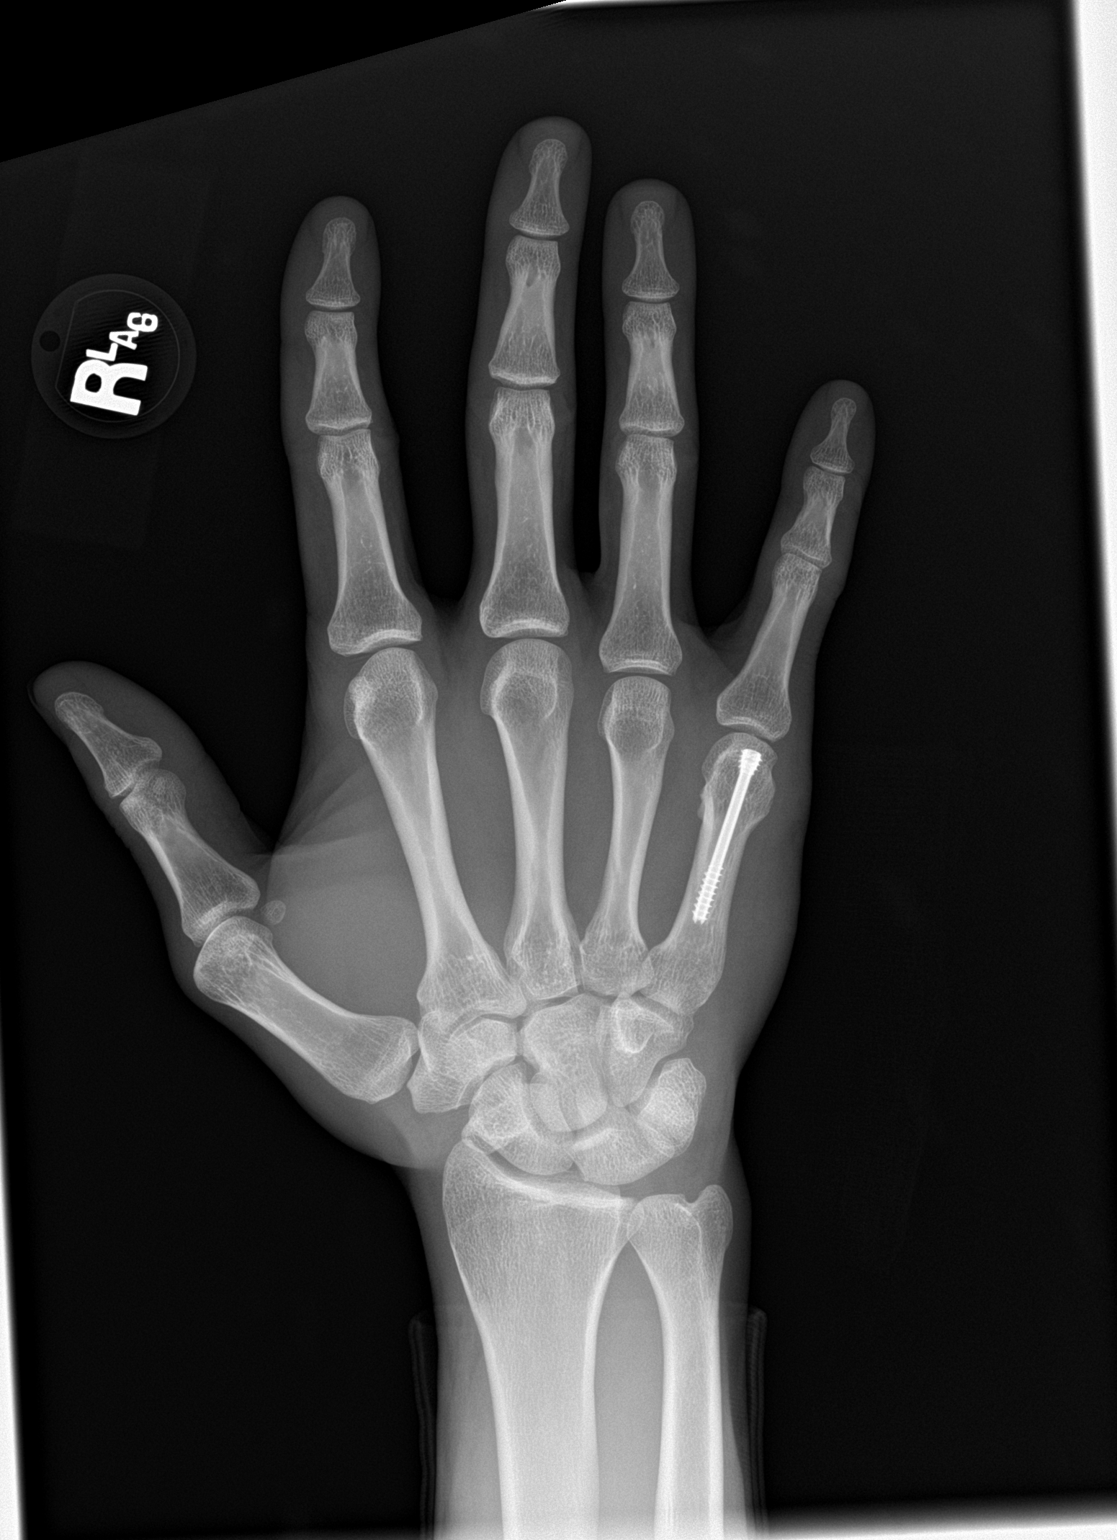

[hand obl]
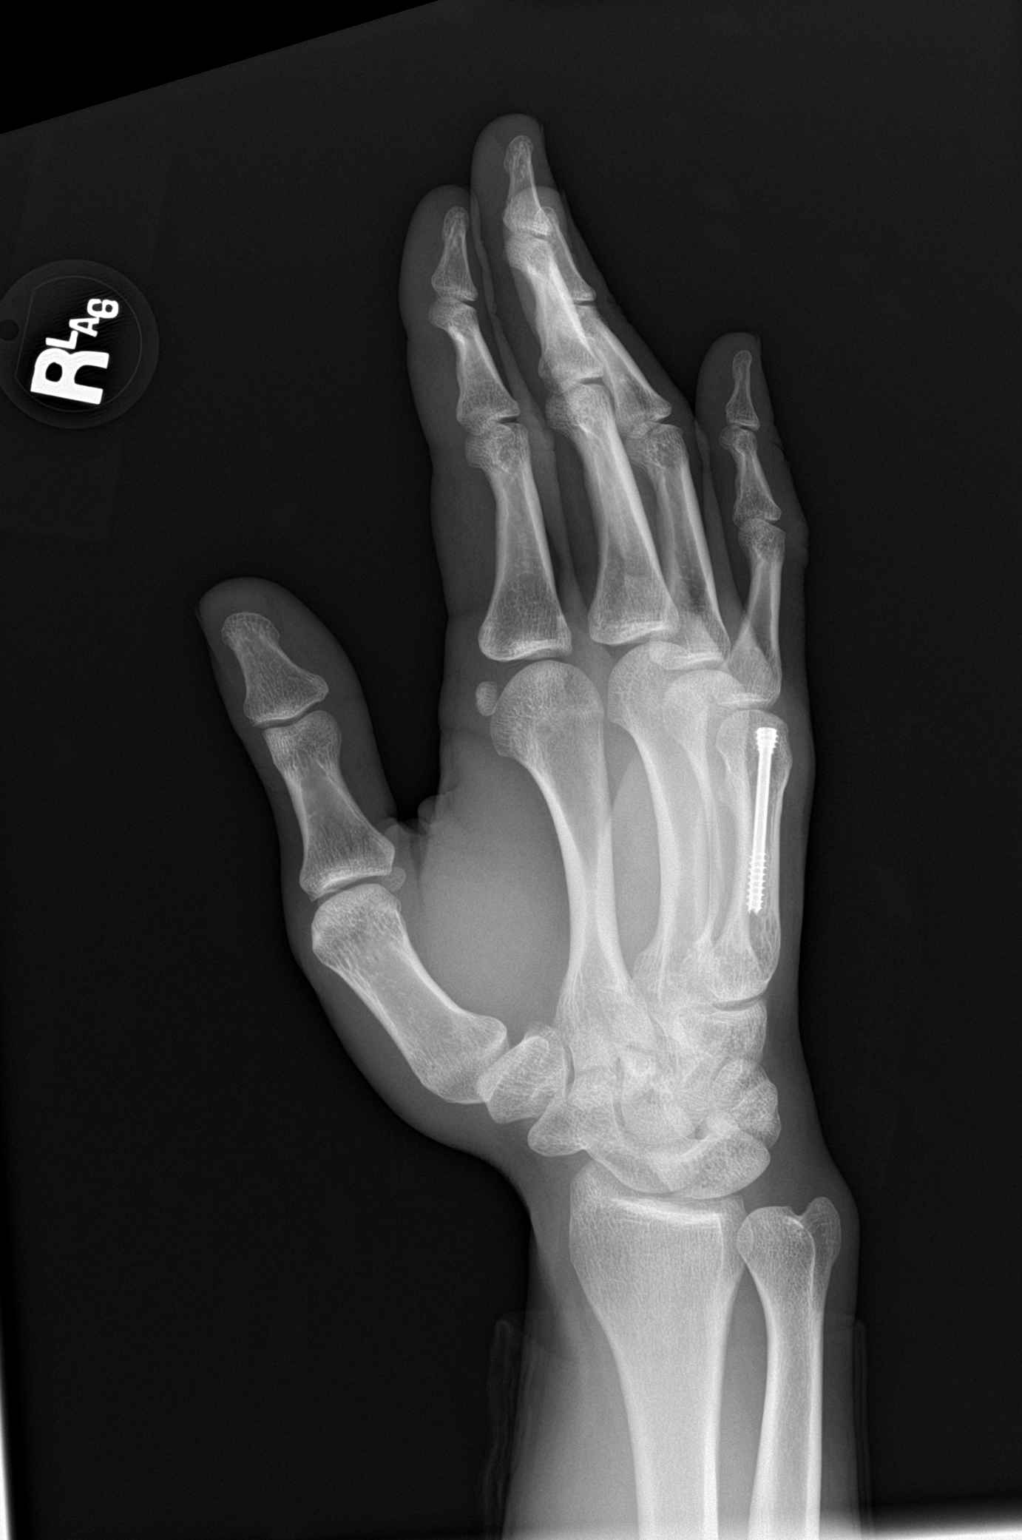

[hand lat]
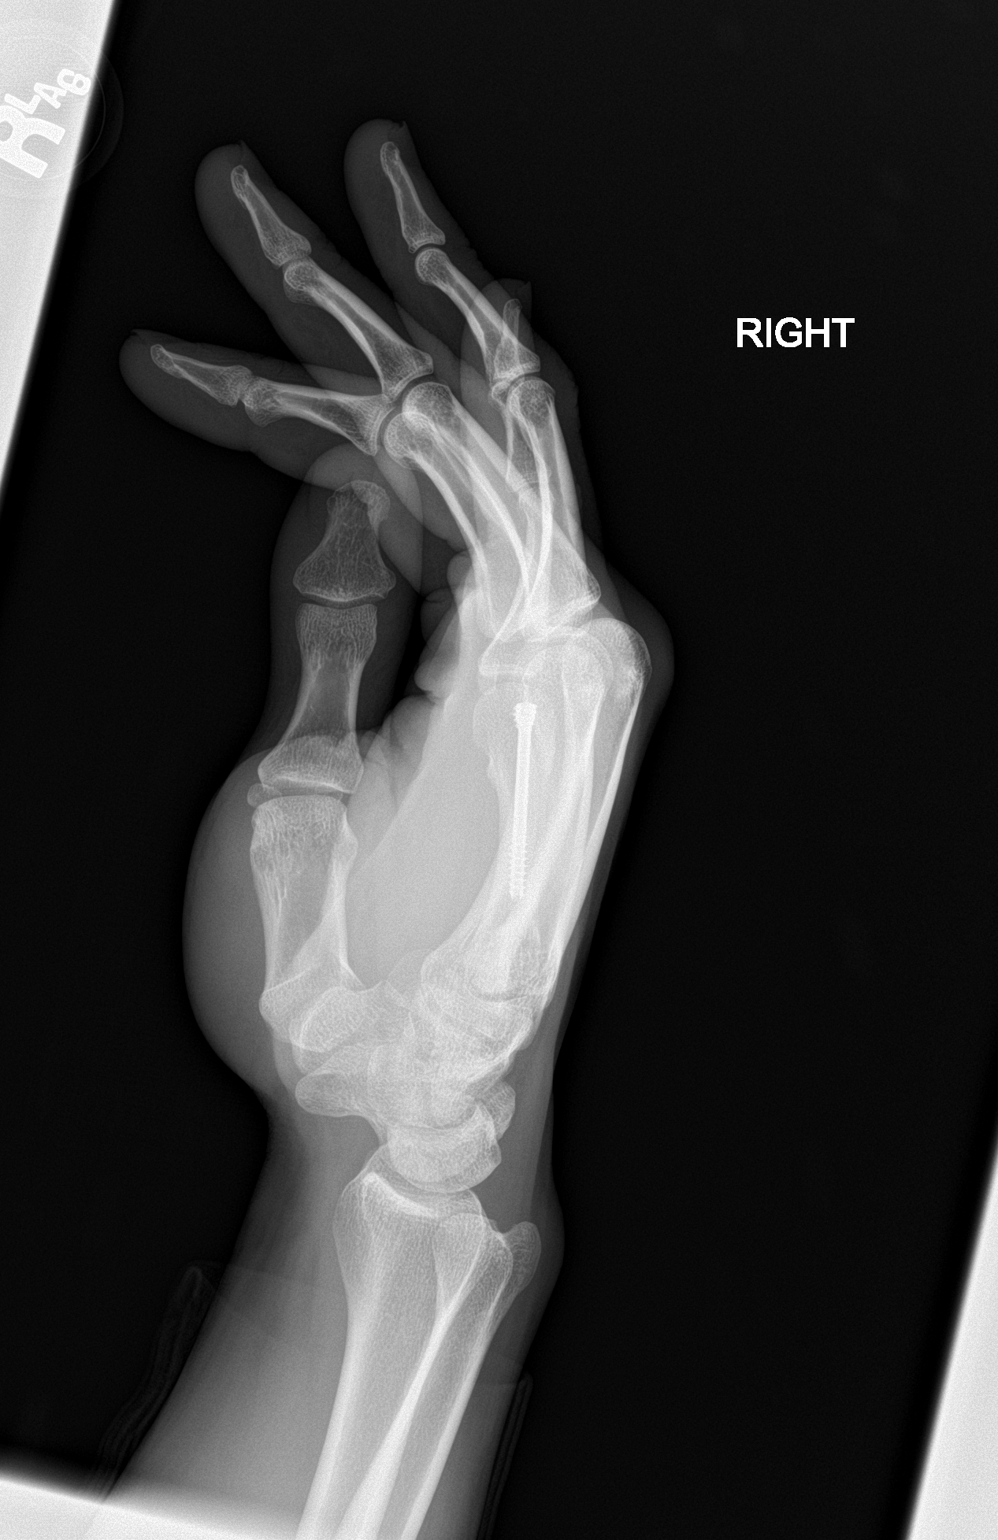

[3 of 3 positions shown; findings below may reference images not displayed]

FINDINGS: No acute bony abnormality. Specifically, no fracture, subluxation,
or dislocation. Screw within the right 5th metacarpal. Soft tissues
are intact. No radiopaque foreign bodies.
IMPRESSION: No acute fracture or foreign body.
